# Patient Record
Sex: Female | Born: 1985 | Race: White | Hispanic: No | Marital: Single | Smoking: Never smoker
Health system: Southern US, Academic
[De-identification: ages and names within clinical notes are randomized; demographics above are authoritative.]

## PROBLEM LIST (undated history)

## (undated) DIAGNOSIS — I10 Essential (primary) hypertension: Secondary | ICD-10-CM

## (undated) DIAGNOSIS — E782 Mixed hyperlipidemia: Secondary | ICD-10-CM

## (undated) HISTORY — DX: Essential (primary) hypertension: I10

## (undated) HISTORY — DX: Mixed hyperlipidemia: E78.2

---

## 2012-12-22 ENCOUNTER — Other Ambulatory Visit (HOSPITAL_COMMUNITY): Payer: Self-pay

## 2020-10-29 IMAGING — MR MRI CERVICAL SPINE WITHOUT CONTRAST
4 of 6 series · 23 of 48 positions shown · IV contrast (gadolinium)
Comparison: None available.

﻿EXAM:  38434   MRI CERVICAL SPINE WITHOUT CONTRAST
INDICATION: Headaches and right-sided neck pain.
TECHNIQUE: Multiplanar multisequential MRI of the cervical spine was performed without gadolinium contrast.

[Series 5: T2 · sagittal · 3.0mm · 0.75mm/px · 7 of 15 slices shown (1 of 3)]
[im 1/15]
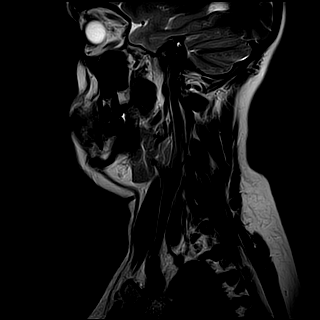
[im 3/15]
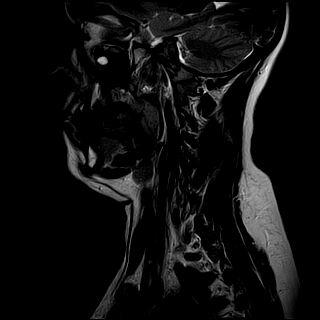
[im 5/15]
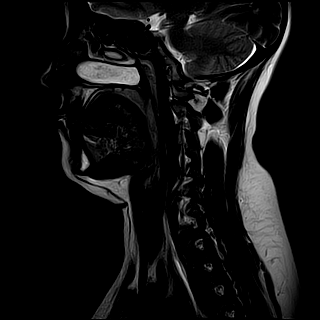
[im 8/15]
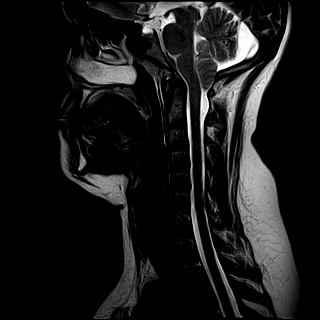
[im 10/15]
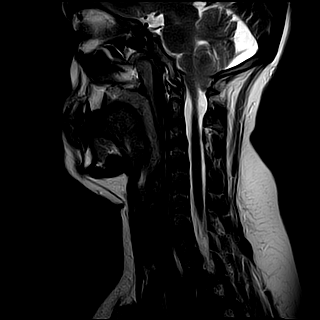
[im 12/15]
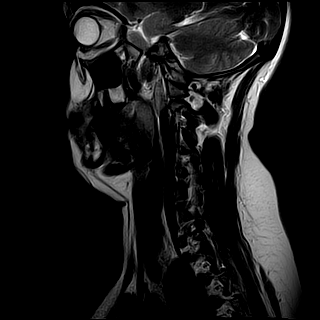
[im 15/15]
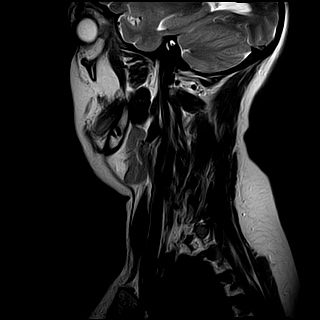

[Series 6: T1 · sagittal · 3.0mm · 0.47mm/px · 3 of 15 slices shown]
[im 3/15]
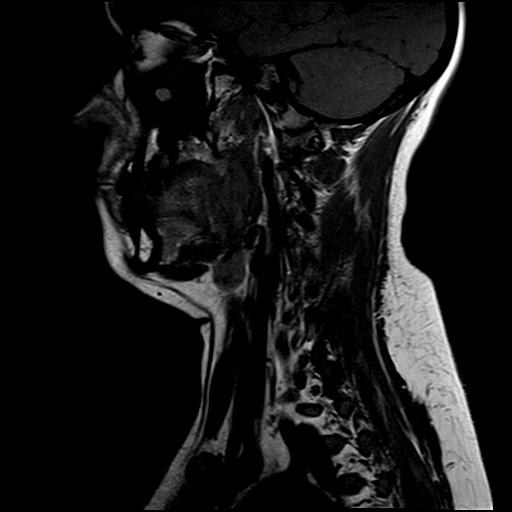
[im 8/15]
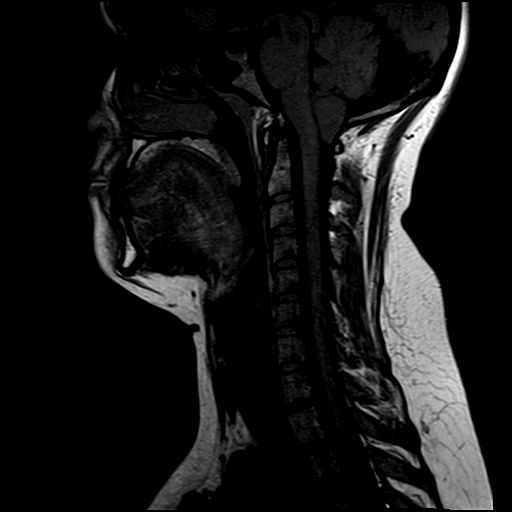
[im 12/15]
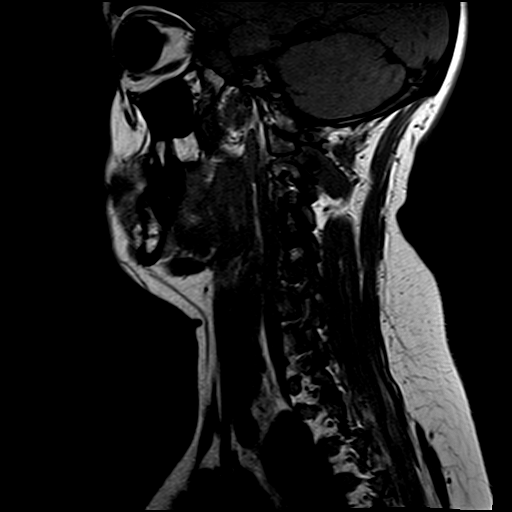

[Series 9: T2 · axial · 3.0mm · 0.39mm/px · z∈[-77,+12]mm · 9 of 18 slices shown (2 of 3)]
[im 1/18]
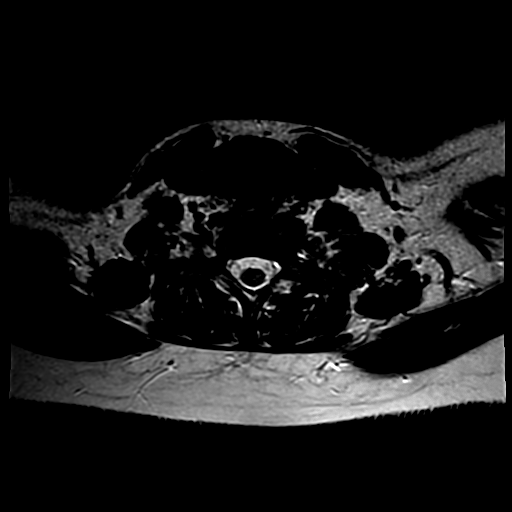
[im 3/18]
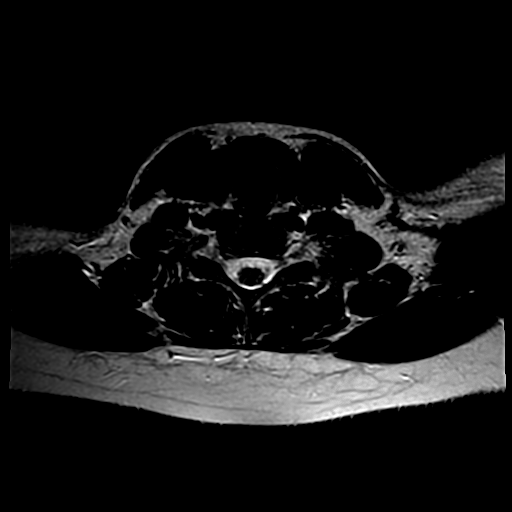
[im 5/18]
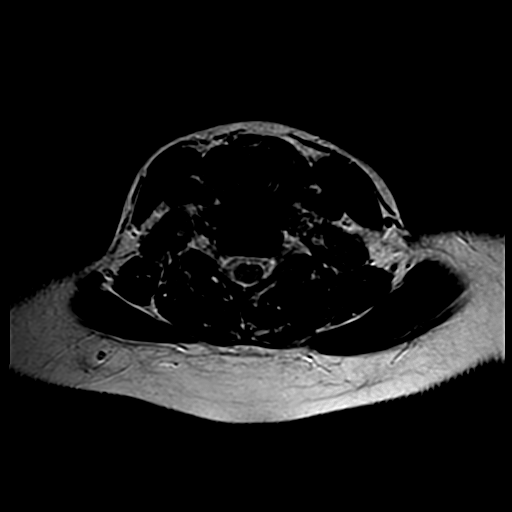
[im 7/18]
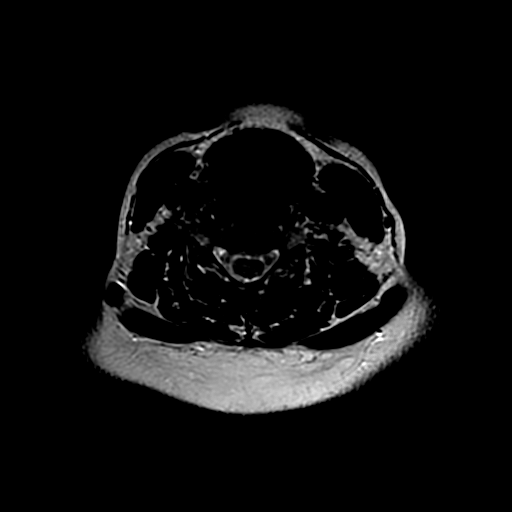
[im 9/18]
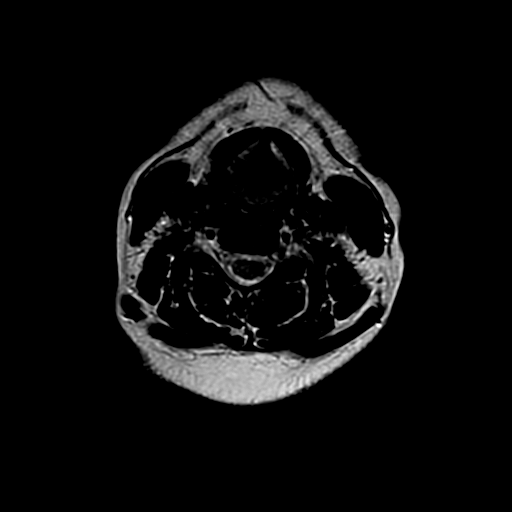
[im 11/18]
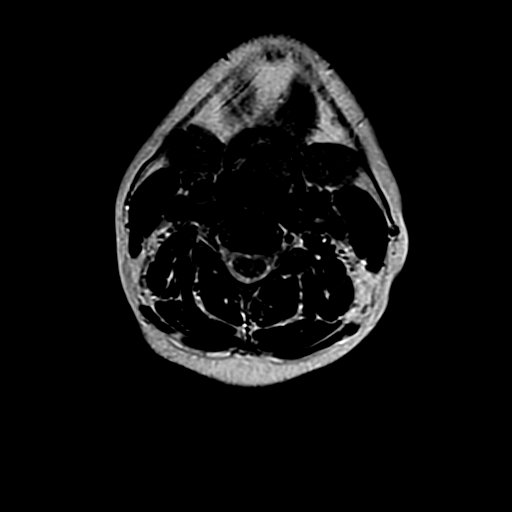
[im 13/18]
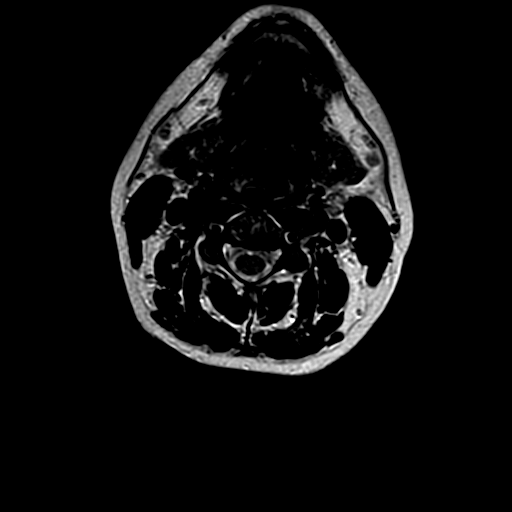
[im 15/18]
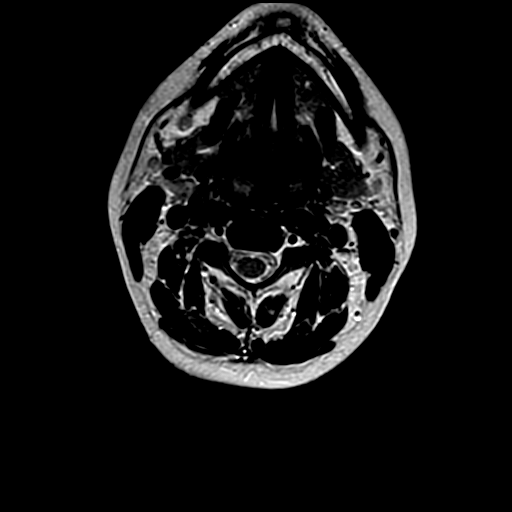
[im 18/18]
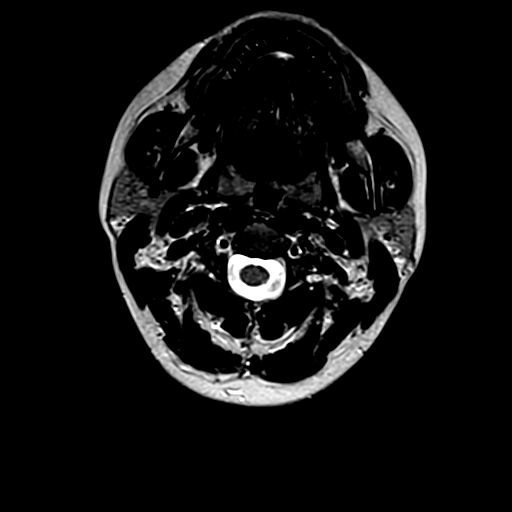

[Series 10: T2 · axial · 4.0mm · 0.35mm/px · z∈[-13,+57]mm · 4 of 18 slices shown (3 of 3)]
[im 1/18]
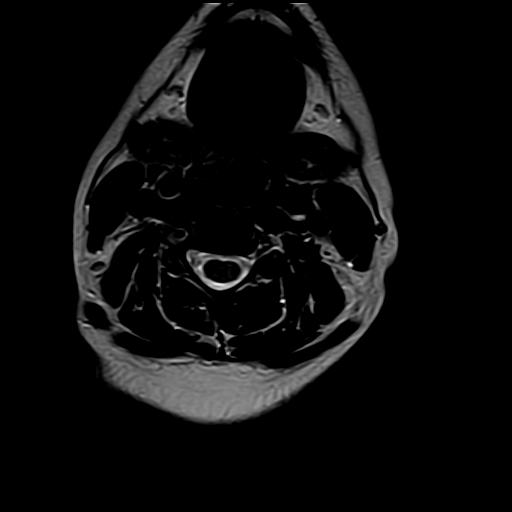
[im 3/18]
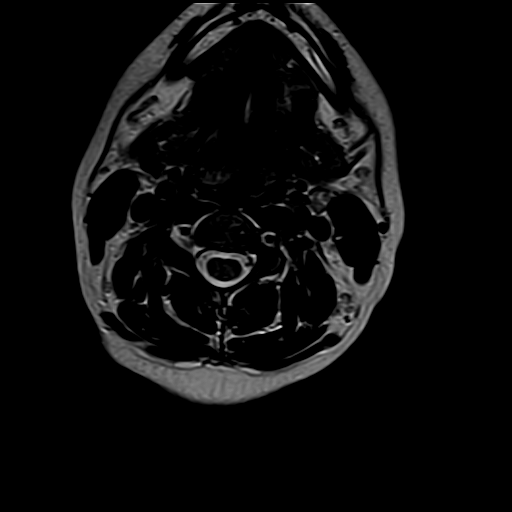
[im 9/18]
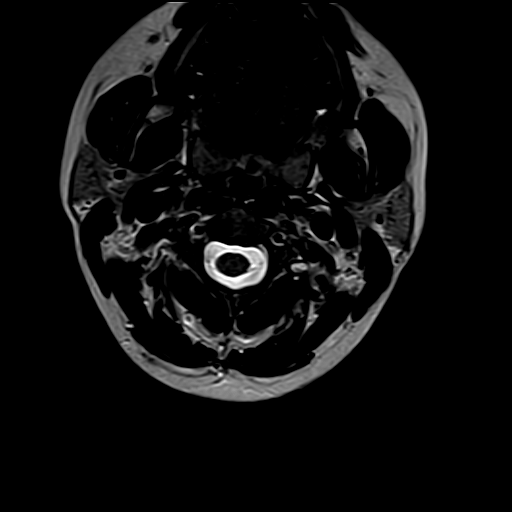
[im 15/18]
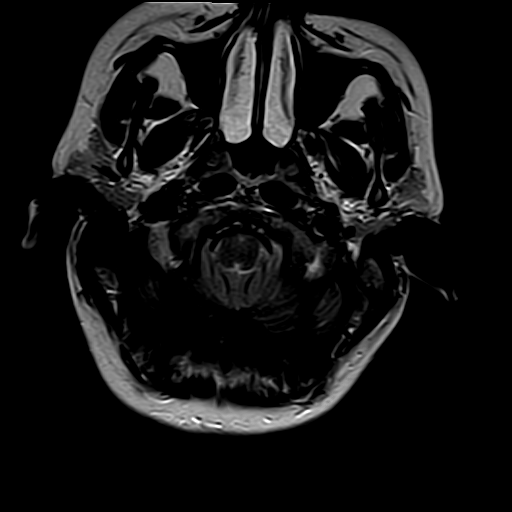

[23 of 48 positions shown; findings below may reference images not displayed]

FINDINGS: Vertebral bodies are normal in height, alignment and signal intensity. There is no acute fracture or subluxation. Visualized spinal cord is normal in signal intensity without evidence of compression at any level. A Chiari 1 malformation is identified with cerebellar tonsils extending approximately 10 mm below the foramen magnum.

There is no significant disc herniation, spinal canal or neural foraminal stenosis at any level.

Paraspinal soft tissues are also unremarkable.
IMPRESSION: Incidentally noted Chiari 1 malformation as detailed above. No significant degenerative changes.

## 2020-10-29 IMAGING — MR MRI BRAIN W/O CONTRAST
8 of 11 series · 34 of 48 positions shown · IV contrast (gadolinium)
Comparison: None available.

﻿EXAM:  MRI BRAIN W/O CONTRAST
INDICATION: Headaches and visual disturbance.
TECHNIQUE: Multiplanar, multisequential MRI of the brain was performed without gadolinium contrast.

[Series 6: DWI · axial · 5.0mm · 1.35mm/px · z∈[-23,+103]mm · 9 of 88 slices shown (1 of 3)]
[im 1/88]
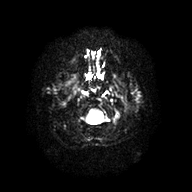
[im 16/88]
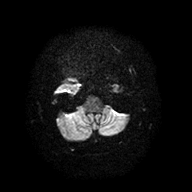
[im 24/88]
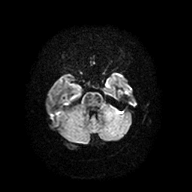
[im 40/88]
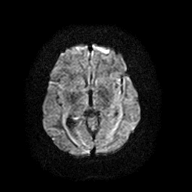
[im 48/88]
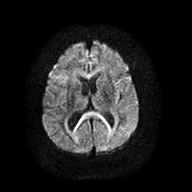
[im 64/88]
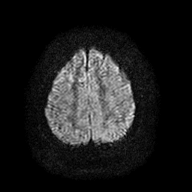
[im 72/88]
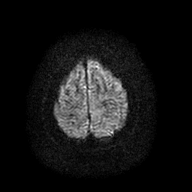
[im 80/88]
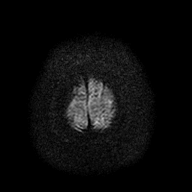
[im 88/88]
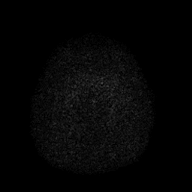

[Series 7: DWI · axial · 5.0mm · 1.35mm/px · z∈[-23,+103]mm · 3 of 22 slices shown (2 of 3)]
[im 1/22]
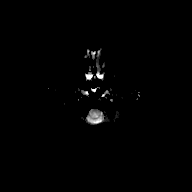
[im 11/22]
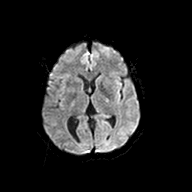
[im 22/22]
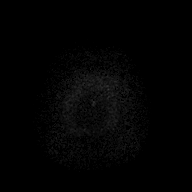

[Series 8: DWI · axial · 5.0mm · 1.35mm/px · z∈[-23,+103]mm · 3 of 22 slices shown (3 of 3)]
[im 1/22]
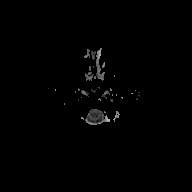
[im 11/22]
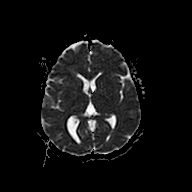
[im 22/22]
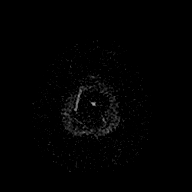

[Series 9: FLAIR · sagittal · 4.0mm · 0.75mm/px · 4 of 26 slices shown (1 of 2)]
[im 1/26]
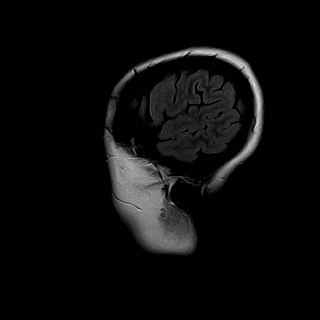
[im 9/26]
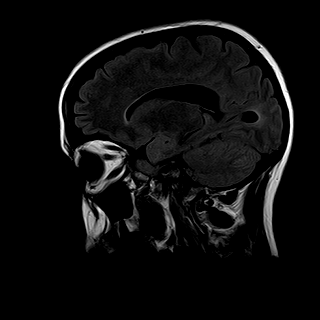
[im 17/26]
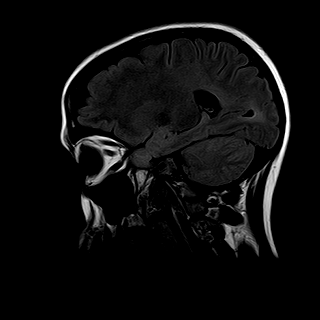
[im 26/26]
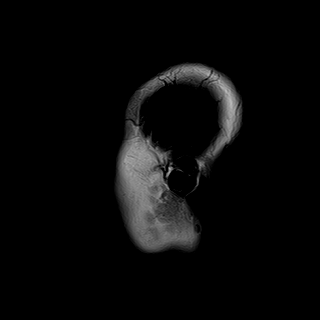

[Series 10: T2 · axial · 5.0mm · 0.43mm/px · z∈[-32,+112]mm · 4 of 25 slices shown (1 of 2)]
[im 1/25]
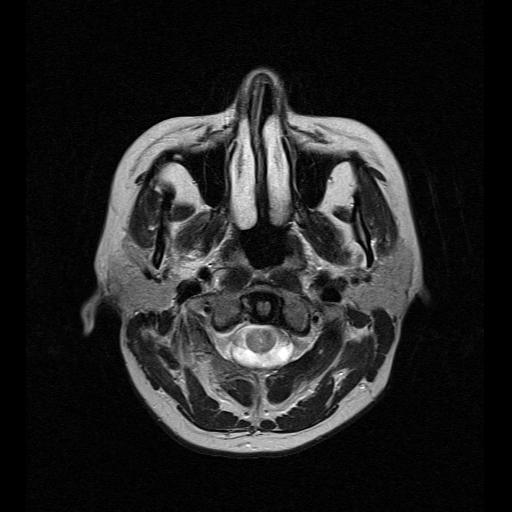
[im 9/25]
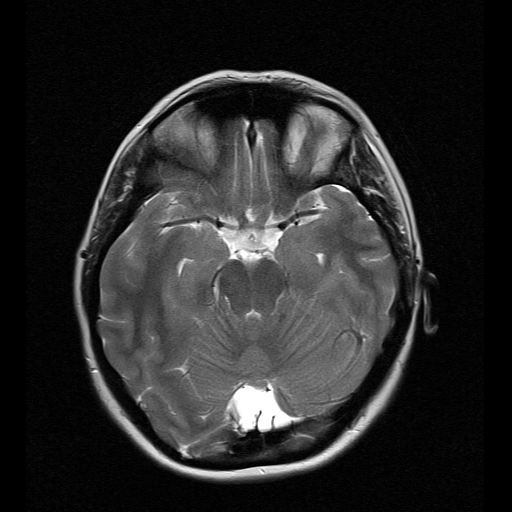
[im 17/25]
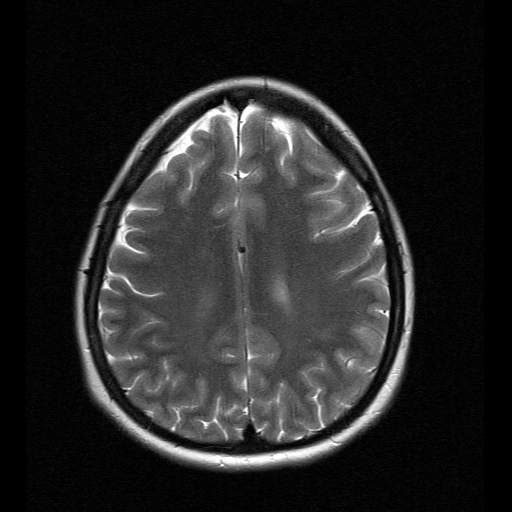
[im 25/25]
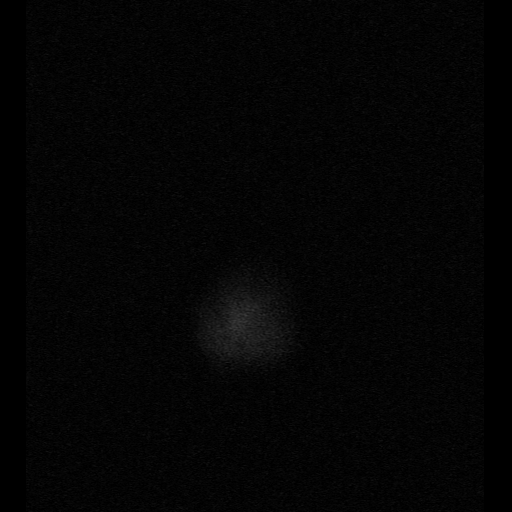

[Series 11: FLAIR · axial · 5.0mm · 0.43mm/px · z∈[-32,+112]mm · 4 of 25 slices shown (2 of 2)]
[im 1/25]
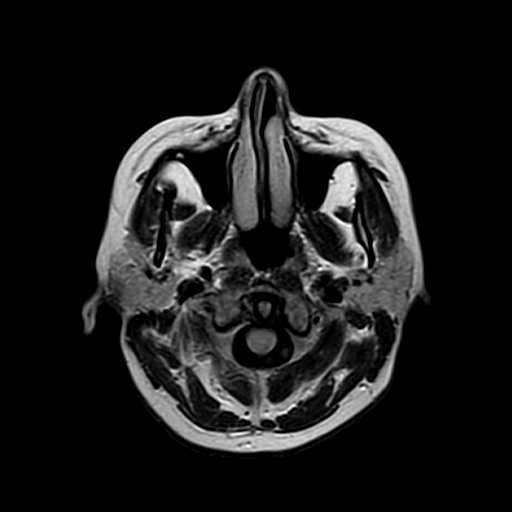
[im 9/25]
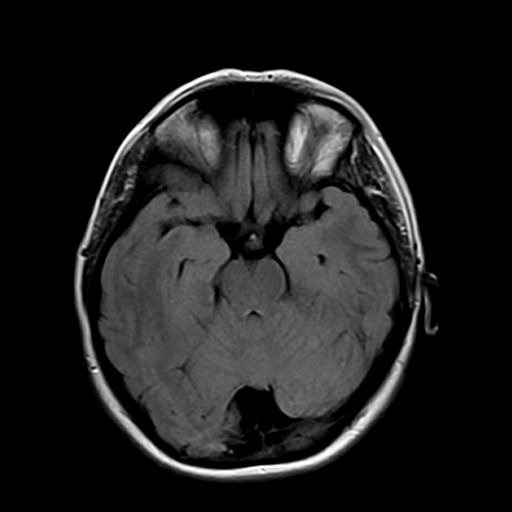
[im 17/25]
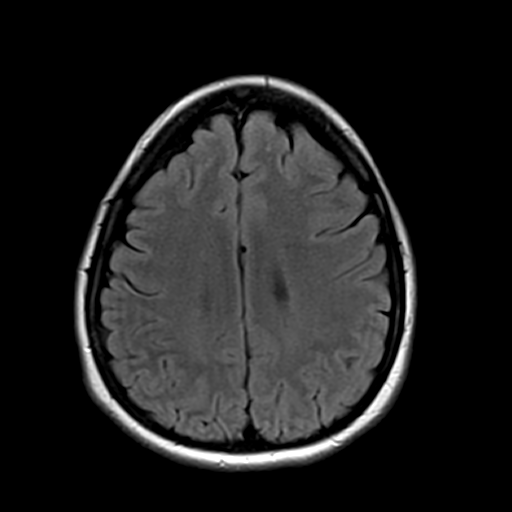
[im 25/25]
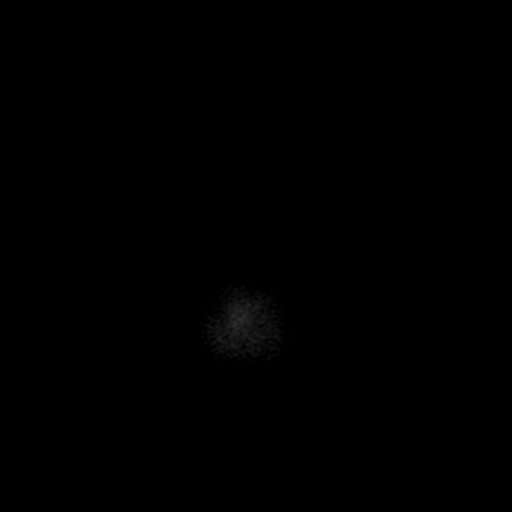

[Series 12: T1 · axial · 5.0mm · 0.43mm/px · z∈[-32,+64]mm · 3 of 25 slices shown]
[im 1/25]
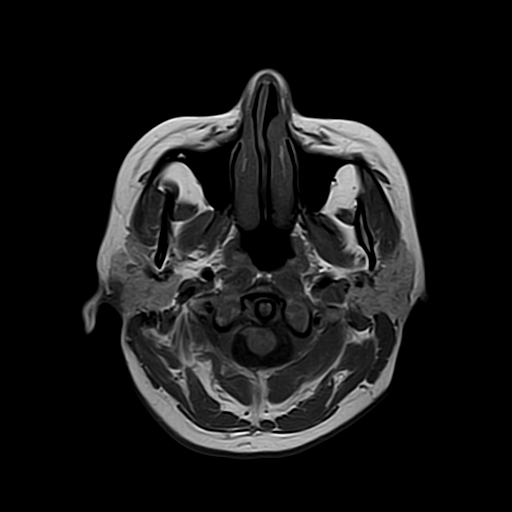
[im 9/25]
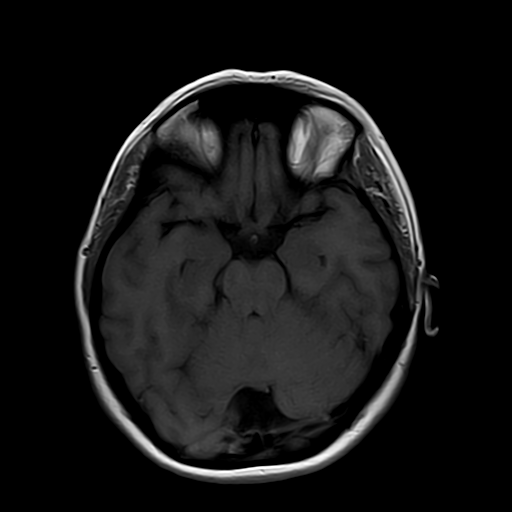
[im 17/25]
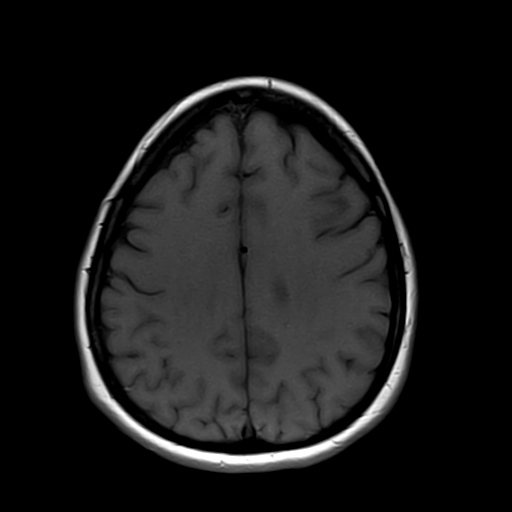

[Series 13: T2 · coronal · 6.0mm · 0.43mm/px · 4 of 24 slices shown (2 of 2)]
[im 1/24]
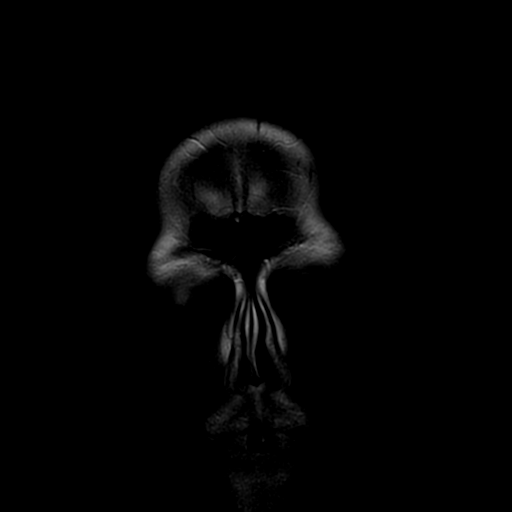
[im 8/24]
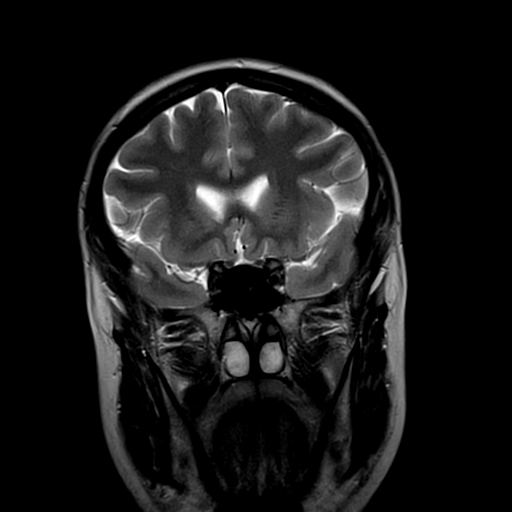
[im 16/24]
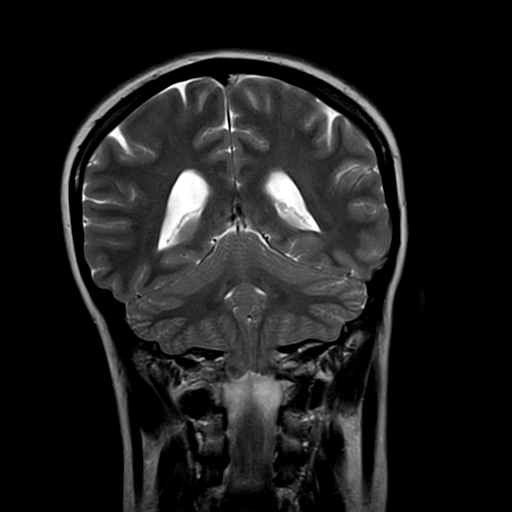
[im 24/24]
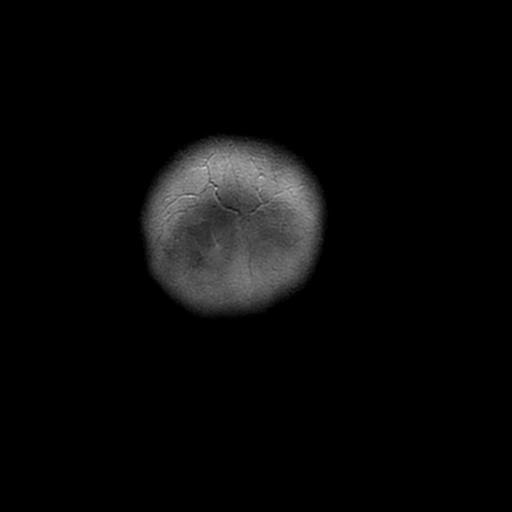

[34 of 48 positions shown; findings below may reference images not displayed]

FINDINGS: Ventricular and sulcal size is normal for the patient's age. A single 5.5 mm T2/FLAIR signal hyperintensity focus within the right frontal deep white matter is suggestive of minimal chronic small vessel ischemic changes. There is no mass effect, midline shift or intracranial hemorrhage. There is no evidence of acute infarction or prior microhemorrhages. Skull base flow voids and basal cisterns are patent. A Chiari 1 malformation is identified with the cerebellar tonsils extending approximately 10 mm below the foramen magnum. There are no extra-axial fluid collections. Visualized paranasal sinuses, mastoid air cells and orbital contents are unremarkable.
IMPRESSION: 1. Minimal chronic small vessel ischemic changes, no acute intracranial abnormality. 

2. Incidentally noted Chiari 1 malformation as detailed above.

## 2020-11-26 ENCOUNTER — Telehealth (HOSPITAL_BASED_OUTPATIENT_CLINIC_OR_DEPARTMENT_OTHER): Payer: Self-pay | Admitting: Neurological Surgery

## 2020-11-26 NOTE — Telephone Encounter (Signed)
Called patient about her appointment on 8/24 and let her know I needed to reschedule due to Dr.Fergus not going to be in the office. I rescheduled patient for 9/7 @315pm  with Dr.Fergus. Patient agreed to the above information.  Maria Bowen  11/26/2020, 13:56

## 2020-11-28 ENCOUNTER — Ambulatory Visit (HOSPITAL_BASED_OUTPATIENT_CLINIC_OR_DEPARTMENT_OTHER): Payer: Self-pay | Admitting: Neurological Surgery

## 2020-12-11 ENCOUNTER — Telehealth (HOSPITAL_BASED_OUTPATIENT_CLINIC_OR_DEPARTMENT_OTHER): Payer: Self-pay | Admitting: Neurological Surgery

## 2020-12-11 NOTE — Telephone Encounter (Signed)
Called patient about her appointment tomorrow 9/7 and asked patient if she was okay with seeing a Mid-Level due to Dr.Fergus not going to be in the office. Patient agreed she was.  Maria Bowen  12/11/2020, 14:08

## 2020-12-12 ENCOUNTER — Encounter (HOSPITAL_BASED_OUTPATIENT_CLINIC_OR_DEPARTMENT_OTHER): Payer: Self-pay | Admitting: Neurological Surgery

## 2020-12-12 ENCOUNTER — Ambulatory Visit: Payer: 59

## 2020-12-12 ENCOUNTER — Other Ambulatory Visit: Payer: Self-pay

## 2020-12-12 VITALS — BP 122/78 | HR 118 | Temp 98.5°F | Resp 16 | Ht 63.0 in | Wt 174.6 lb

## 2020-12-12 DIAGNOSIS — R519 Headache, unspecified: Secondary | ICD-10-CM

## 2020-12-12 DIAGNOSIS — G935 Compression of brain: Secondary | ICD-10-CM

## 2020-12-12 NOTE — H&P (Signed)
Department of Neurosurgery  Consultation    Name:  Maria Bowen     MR#:    [id number]   DOB:  1986/02/11     Date/Time: 12/19/2020  09:50  ______________________________________________________________________    Assessment:   ENCOUNTER DIAGNOSES     ICD-10-CM   1. 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 Imaging demonstrates a Chiari malformation.  Headaches and not felt to be related to Chiari malformation, as patient has no other complaints or symptoms.       Plan:     Advised patient do not feel chronic headaches are related to Chiari malformation.  Discussed possible referral to [facility] at [facility], patient will discuss with primary care provider.  She may follow up in clinic for any new or worsening symptoms as needed.Patient states understanding and is in agreement with plan of care. All questions have been answered to the satisfaction of the patient.     Patient seen and evaluated independently in clinic.    Leander Rams, APRN,FNP-BC  Assistant Professor  [facility]   12/19/2020 9:50 AM   _______________________________________________________________________    Chief Complaint:  headaches    History of Present Illness:  Maria Bowen  is a 35 y.o. female who has been referred [facility] with headaches.  Patient reports a history of headache, with more episodes throughout the last year. Patient states she does experience some nausea with headaches. Chief complaint is headache that starts in the right occipital region. Patient reports she has not had any headaches since June 2022. She denies any gait instability, vision changes, muscle weakness, gait instability vertigo or numbness/paresthesias.    Conservative Treatment to date:  Time, Rest, NSAID'S    Past Medical/Surgical History:  History reviewed. No pertinent past medical  history.        Family History:  Family Medical History:    None           Allergies:  No Known Allergies    Home Medications:  Prior to Admission medications    Medication Sig Start Date End Date Taking? Authorizing Provider   loratadine (CLARITIN) 10 mg Oral Tablet Take 10 mg by mouth Once a day   Yes Provider, Historical   multivitamin with iron Oral Tablet Take 1 Tablet by mouth Once a day   Yes Provider, Historical       Social History:   Social History     Tobacco Use    Smoking status: Never Smoker    Smokeless tobacco: Never Used   Substance Use Topics    Alcohol use: Not Currently    Drug use: Not Currently        Review of Systems:  Constitutional: no weight loss, fever, night sweats  GI: No change in bowel habits, No Nausea, vomitting, diarrhea, or constipation  GU: Negative for dysuria, frequency and incontinence  Neuro: negative    Physical Exam:  BP 122/78   Pulse (!) 118   Temp 36.9 C (98.5 F)   Resp 16   Ht 1.6 m (5\' 3" )   Wt 79.2 kg (174 lb 9.7 oz)   SpO2 98%   BMI 30.93 kg/m         Cervical:   Palpation:  No tenderness to palpation  ROM:  Full active range of motion  Spurling's: Negative    Shoulder:  Palpation: No tenderness to palpation  ROM: Full active range of motion  Jobe's (Empty Can): Negative  Cross Arm Test: Negative     Arm/Hand:  Palpation: No tenderness to palpation  Phalen's: Negative  Tinel's: Negative     Neurologic Exam:     Alert and oriented x3 with normal speech.    Sensation:  Intact to light touch in upper extremities  Intact to light touch in lower extremities.    Gait: Normal    Motor examination:   Arm Right Left Leg Right Left   Deltoid (C5) 5/5 5/5 Iliopsoas (L2) 5/5 5/5   Bicep 5/5 5/5      Wrist extension (C6) 5/5 5/5 Quadricep (L3/4) 5/5 5/5   Tricep (C7) 5/5 5/5 Anterior Tibialis (L5) 5/5 5/5   Finger flexion (C8) 5/5 5/5 Gastroc-soleus (S1) 5/5 5/5       Reflexes:    Bicep BR Triceps Patella Achilles Babinski Ankle Clonus Hoffman's   Right 2+ 2+ 2+ 2+ 2+  Downgoing Not Present Not Present   Left 2+ 2+ 2+ 2+ 2+ Downgoing Not Present Not Present          Imaging:  I reviewed the study(ies) myself and made my own interpretation.  My interpretation can be found in the HPI and/or Assessment.  The following is the radiology report:

## 2023-03-30 ENCOUNTER — Encounter (INDEPENDENT_AMBULATORY_CARE_PROVIDER_SITE_OTHER): Payer: Self-pay

## 2023-03-30 DIAGNOSIS — I10 Essential (primary) hypertension: Secondary | ICD-10-CM | POA: Insufficient documentation

## 2023-03-30 DIAGNOSIS — E782 Mixed hyperlipidemia: Secondary | ICD-10-CM | POA: Insufficient documentation

## 2023-04-09 ENCOUNTER — Encounter (INDEPENDENT_AMBULATORY_CARE_PROVIDER_SITE_OTHER): Payer: Self-pay

## 2023-04-14 ENCOUNTER — Encounter (INDEPENDENT_AMBULATORY_CARE_PROVIDER_SITE_OTHER): Payer: Self-pay

## 2023-04-16 ENCOUNTER — Encounter (INDEPENDENT_AMBULATORY_CARE_PROVIDER_SITE_OTHER): Payer: 59

## 2023-06-30 ENCOUNTER — Encounter (INDEPENDENT_AMBULATORY_CARE_PROVIDER_SITE_OTHER): Payer: 59 | Admitting: PHYSICIAN ASSISTANT

## 2023-07-02 ENCOUNTER — Encounter (INDEPENDENT_AMBULATORY_CARE_PROVIDER_SITE_OTHER): Payer: Self-pay | Admitting: PHYSICIAN ASSISTANT
